# Patient Record
Sex: Male | Born: 1990 | Race: White | Hispanic: No | Marital: Single | Smoking: Former smoker
Health system: Southern US, Community
[De-identification: ages and names within clinical notes are randomized; demographics above are authoritative.]

---
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
## 2013-11-01 ENCOUNTER — Encounter (HOSPITAL_BASED_OUTPATIENT_CLINIC_OR_DEPARTMENT_OTHER): Payer: Self-pay | Admitting: Emergency Medicine

## 2013-11-01 ENCOUNTER — Emergency Department (HOSPITAL_BASED_OUTPATIENT_CLINIC_OR_DEPARTMENT_OTHER)
Admission: EM | Admit: 2013-11-01 | Discharge: 2013-11-01 | Disposition: A | Discharge status: Home / Self Care | Payer: Managed Care, Other (non HMO) | Attending: Emergency Medicine | Admitting: Emergency Medicine

## 2013-11-01 ENCOUNTER — Emergency Department (HOSPITAL_BASED_OUTPATIENT_CLINIC_OR_DEPARTMENT_OTHER): Payer: Managed Care, Other (non HMO)

## 2013-11-01 DIAGNOSIS — S60222A Contusion of left hand, initial encounter: Secondary | ICD-10-CM

## 2013-11-01 DIAGNOSIS — Y9389 Activity, other specified: Secondary | ICD-10-CM | POA: Insufficient documentation

## 2013-11-01 DIAGNOSIS — Z87891 Personal history of nicotine dependence: Secondary | ICD-10-CM | POA: Insufficient documentation

## 2013-11-01 DIAGNOSIS — S63509A Unspecified sprain of unspecified wrist, initial encounter: Secondary | ICD-10-CM | POA: Insufficient documentation

## 2013-11-01 DIAGNOSIS — S60229A Contusion of unspecified hand, initial encounter: Secondary | ICD-10-CM | POA: Insufficient documentation

## 2013-11-01 DIAGNOSIS — W2209XA Striking against other stationary object, initial encounter: Secondary | ICD-10-CM | POA: Insufficient documentation

## 2013-11-01 DIAGNOSIS — Y929 Unspecified place or not applicable: Secondary | ICD-10-CM | POA: Insufficient documentation

## 2013-11-01 DIAGNOSIS — S63502A Unspecified sprain of left wrist, initial encounter: Secondary | ICD-10-CM

## 2013-11-01 MED ORDER — IBUPROFEN 100 MG/5ML PO SUSP: 600.0000 mg | Freq: Once | ORAL | Status: AC

## 2013-11-01 MED ORDER — IBUPROFEN 200 MG PO TABS
ORAL_TABLET | ORAL | Status: AC
  Administered 2013-11-01: 200 mg
  Filled 2013-11-01: qty 1

## 2013-11-01 MED ORDER — IBUPROFEN 400 MG PO TABS
ORAL_TABLET | ORAL | Status: AC
  Administered 2013-11-01: 400 mg
  Filled 2013-11-01: qty 1

## 2013-11-01 MED ORDER — NAPROXEN 500 MG PO TABS: 500.0000 mg | ORAL_TABLET | Freq: Two times a day (BID) | ORAL | Status: AC
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
## 2013-11-01 NOTE — ED Provider Notes (Signed)
History/physical exam/procedure(s) were performed by non-physician practitioner and as supervising physician I was immediately available for consultation/collaboration. I have reviewed all notes and am in agreement with care and plan.   Phyllistine Domingos S Lonnetta Kniskern, MD 11/01/13 2333 

## 2015-10-24 IMAGING — CR DG HAND COMPLETE 3+V*L*
3 series · 3 of 3 positions shown · non-contrast
Comparison: None.

CLINICAL DATA: Left hand/wrist pain

EXAM:
LEFT HAND - COMPLETE 3+ VIEW

[x hand pa left]
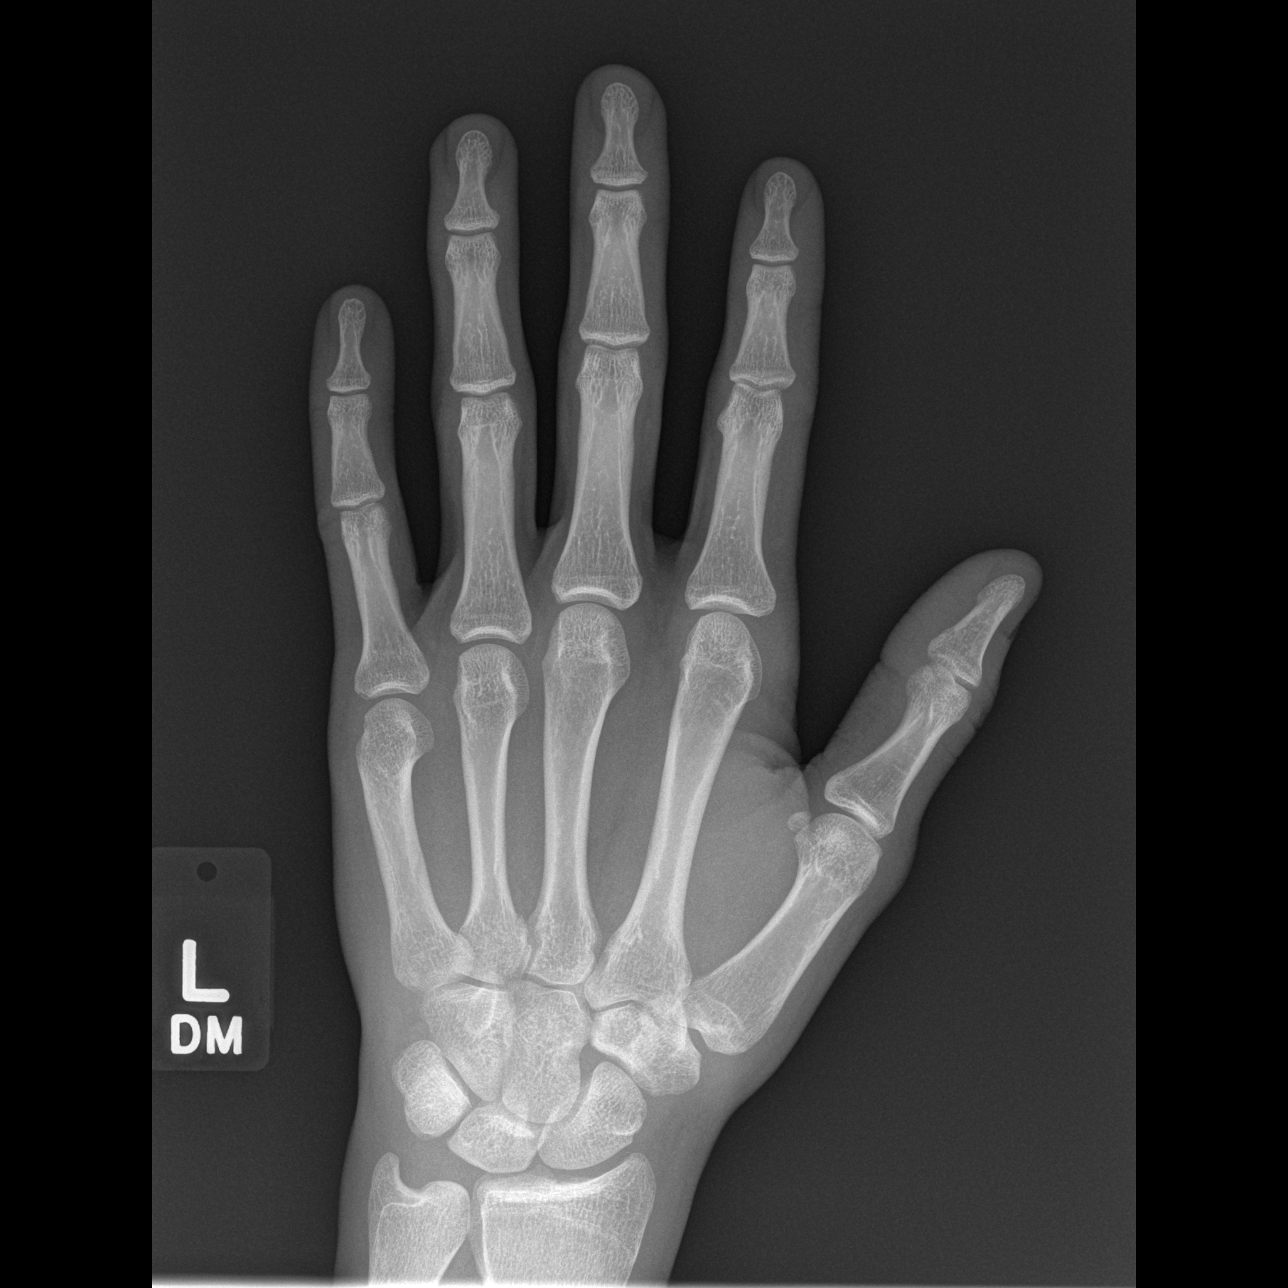

[x hand oblique left]
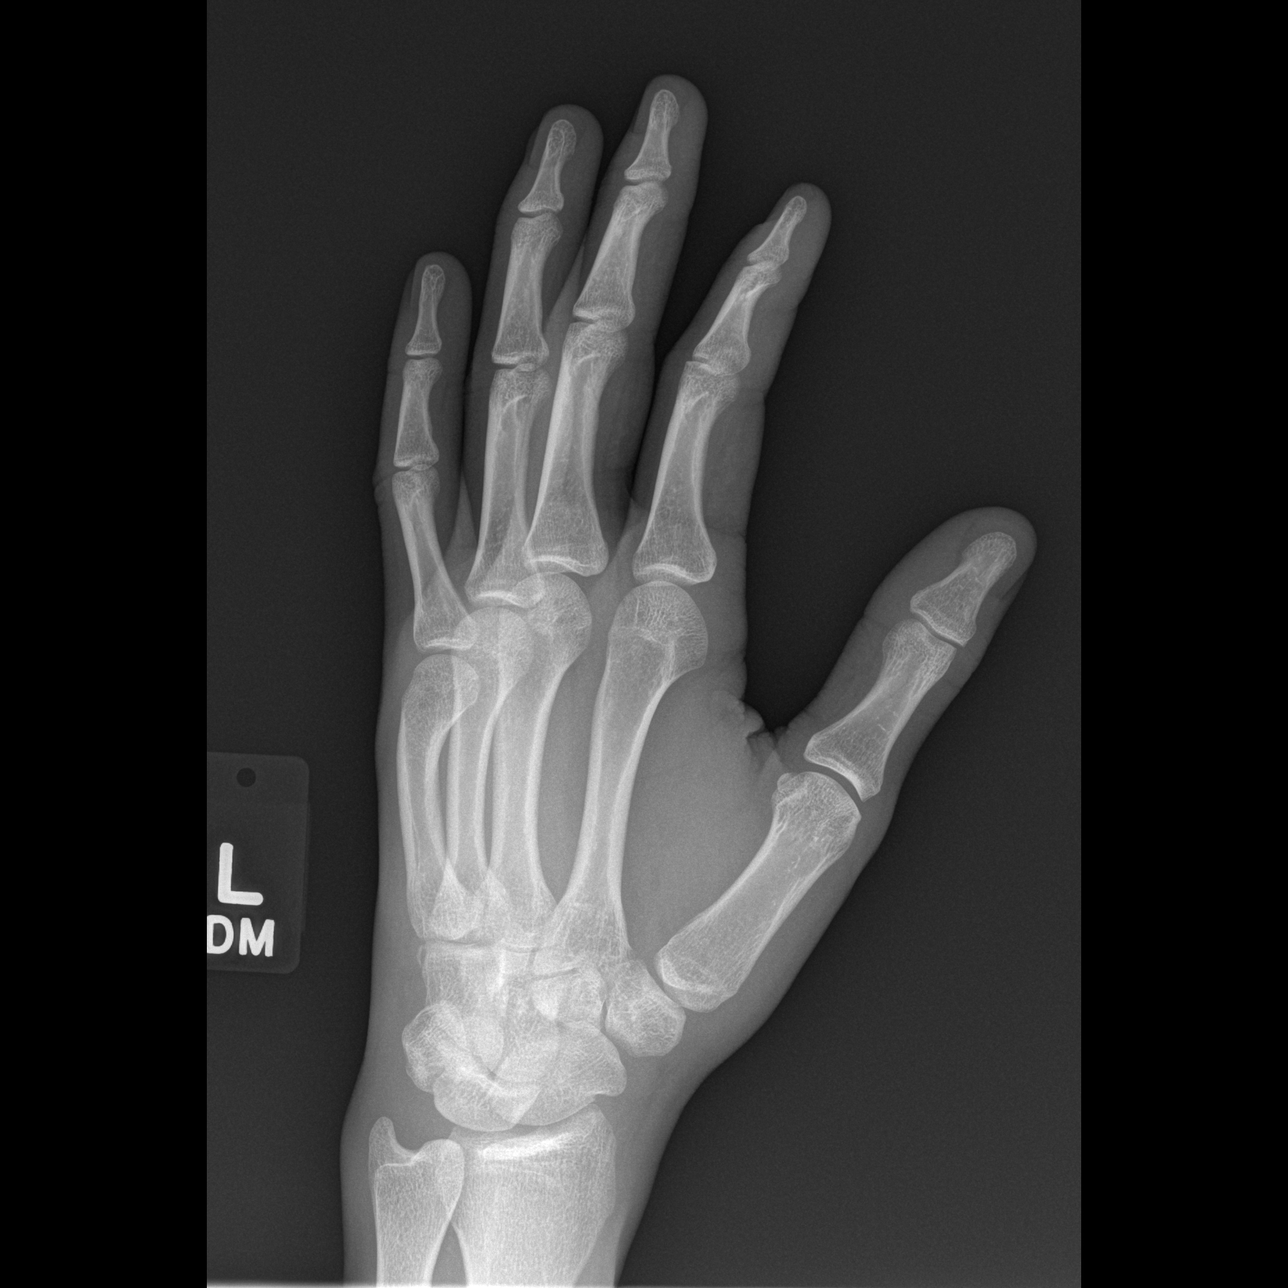

[x hand lat left]
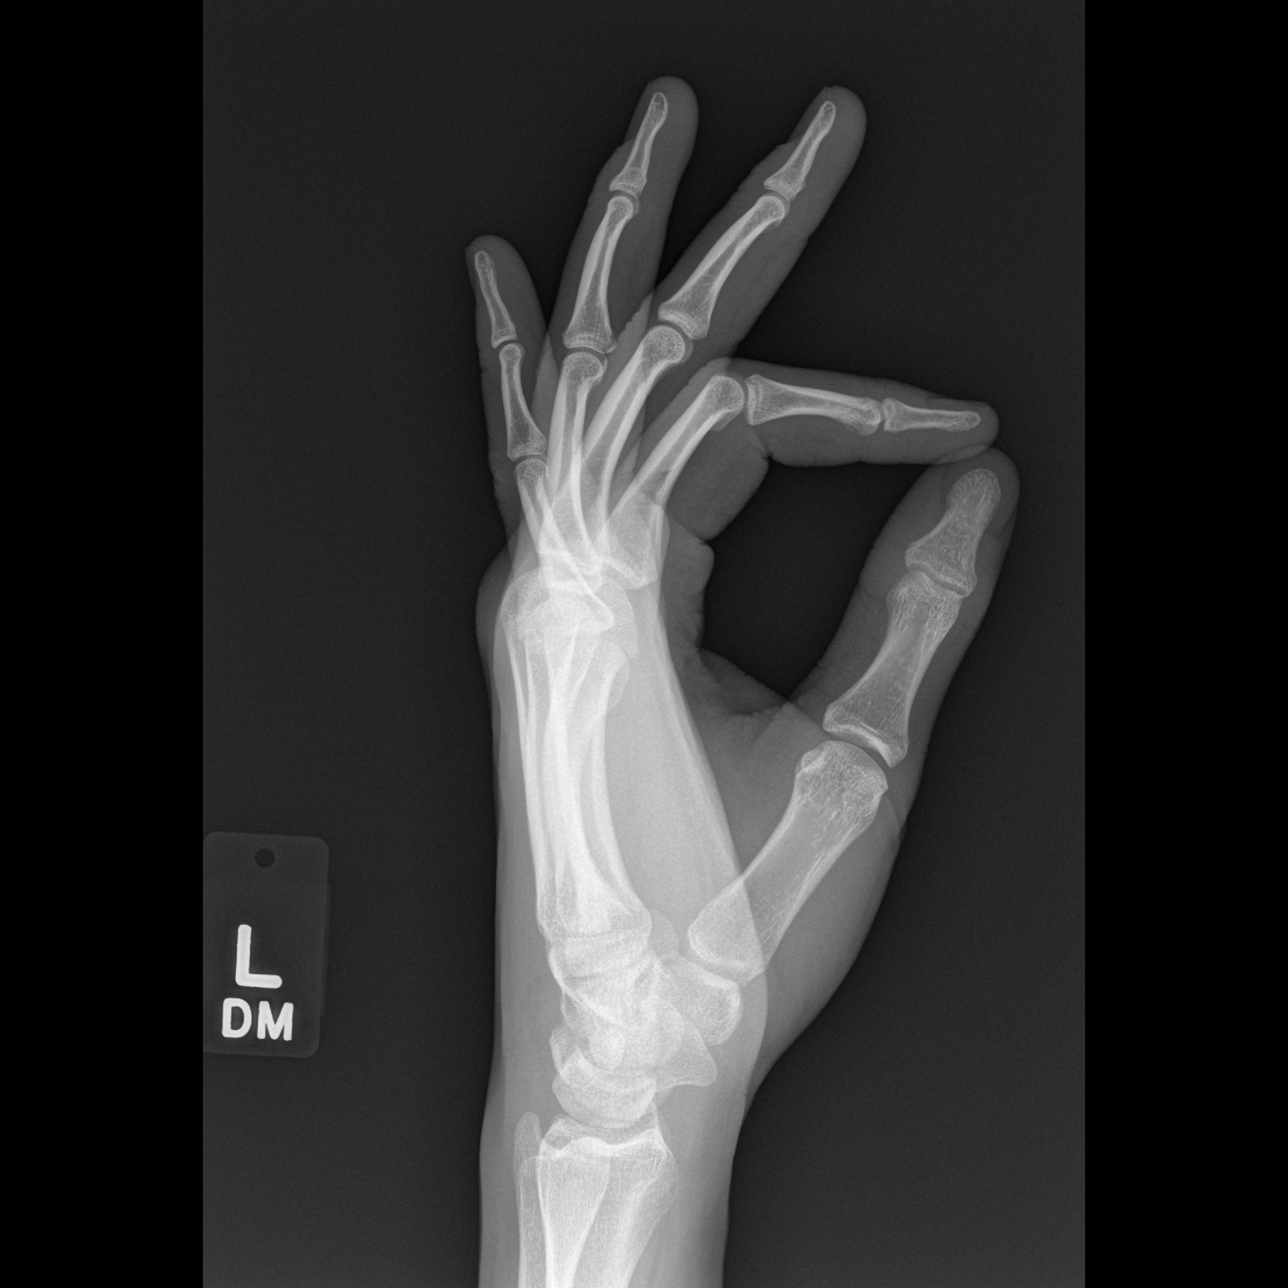

[3 of 3 positions shown; findings below may reference images not displayed]

FINDINGS: No fracture or dislocation is seen.

Mild bowing of the 5th metacarpal, possibly reflecting healed
deformity related to prior trauma.

The joint spaces are preserved.

The visualized soft tissues are unremarkable.
IMPRESSION: No fracture or dislocation is seen.

## 2016-04-01 ENCOUNTER — Emergency Department (HOSPITAL_COMMUNITY): Payer: Managed Care, Other (non HMO)

## 2016-04-01 ENCOUNTER — Encounter (HOSPITAL_COMMUNITY): Payer: Self-pay | Admitting: Emergency Medicine

## 2016-04-01 ENCOUNTER — Emergency Department (HOSPITAL_COMMUNITY)
Admission: EM | Admit: 2016-04-01 | Discharge: 2016-04-02 | Disposition: A | Discharge status: Home / Self Care | Payer: Managed Care, Other (non HMO) | Attending: Emergency Medicine | Admitting: Emergency Medicine

## 2016-04-01 DIAGNOSIS — R0789 Other chest pain: Secondary | ICD-10-CM

## 2016-04-01 DIAGNOSIS — Z87891 Personal history of nicotine dependence: Secondary | ICD-10-CM | POA: Insufficient documentation

## 2016-04-01 DIAGNOSIS — R002 Palpitations: Secondary | ICD-10-CM | POA: Insufficient documentation

## 2016-04-01 DIAGNOSIS — R079 Chest pain, unspecified: Secondary | ICD-10-CM | POA: Diagnosis present

## 2016-04-01 LAB — CBC
HCT: 47.2 % (ref 39.0–52.0)
HEMOGLOBIN: 15.9 g/dL (ref 13.0–17.0)
MCH: 30.1 pg (ref 26.0–34.0)
MCHC: 33.7 g/dL (ref 30.0–36.0)
MCV: 89.2 fL (ref 78.0–100.0)
PLATELETS: 238 10*3/uL (ref 150–400)
RBC: 5.29 MIL/uL (ref 4.22–5.81)
RDW: 12.7 % (ref 11.5–15.5)
WBC: 10 10*3/uL (ref 4.0–10.5)

## 2016-04-01 LAB — BASIC METABOLIC PANEL
ANION GAP: 11 (ref 5–15)
BUN: 10 mg/dL (ref 6–20)
CALCIUM: 9.8 mg/dL (ref 8.9–10.3)
CO2: 23 mmol/L (ref 22–32)
CREATININE: 0.92 mg/dL (ref 0.61–1.24)
Chloride: 99 mmol/L — ABNORMAL LOW (ref 101–111)
Glucose, Bld: 98 mg/dL (ref 65–99)
Potassium: 3.8 mmol/L (ref 3.5–5.1)
SODIUM: 133 mmol/L — AB (ref 135–145)

## 2016-04-01 LAB — I-STAT TROPONIN, ED: TROPONIN I, POC: 0 ng/mL (ref 0.00–0.08)

## 2016-04-01 MED ORDER — KETOROLAC TROMETHAMINE 10 MG PO TABS: 10.0000 mg | ORAL_TABLET | Freq: Four times a day (QID) | ORAL | 0 refills | Status: AC | PRN

## 2016-04-01 MED ORDER — KETOROLAC TROMETHAMINE 30 MG/ML IJ SOLN
30.0000 mg | Freq: Once | INTRAMUSCULAR | Status: AC
  Administered 2016-04-01: 30 mg via INTRAMUSCULAR
  Filled 2016-04-01: qty 1

## 2016-04-01 NOTE — ED Provider Notes (Signed)
[facility]-EMERGENCY DEPT Provider Note   CSN: [id number] Arrival date & time: 04/01/16  1716     History   Chief Complaint Chief Complaint  Patient presents with  . Chest Pain    HPI Danny Hicks is a 25 y.o. male.  HPI  Patient presents with ongoing chest pain. Patient is here with his wife assists with the history of present illness. Over the past few days the patient has had episodes of brief, moderate left-sided chest pain. However, today, for the entirety of the day, approximately 12 hours patient has had pain focally the left pectoralis region. Pain radiates posteriorly. There is no associated dyspnea. There is mild cough, slight temperature elevation, but no fever. Patient is generally well, denies medical problems, does not smoke. No recent travel, other health or diet changes. Patient has one family member recently diagnosed with infection.   History reviewed. No pertinent past medical history.  There are no active problems to display for this patient.   History reviewed. No pertinent surgical history.     Home Medications    Prior to Admission medications   Medication Sig Start Date End Date Taking? Authorizing Provider  naproxen (NAPROSYN) 500 MG tablet Take 1 tablet (500 mg total) by mouth 2 (two) times daily. 11/01/13   Hope Orlene OchM Neese, NP    Family History No family history on file.  Social History Social History  Substance Use Topics  . Smoking status: Former Games developermoker  . Smokeless tobacco: Never Used  . Alcohol use Yes     Allergies   Review of patient's allergies indicates no known allergies.   Review of Systems Review of Systems  Constitutional:       Per HPI, otherwise negative  HENT:       Per HPI, otherwise negative  Respiratory:       Per HPI, otherwise negative  Cardiovascular:       Per HPI, otherwise negative  Gastrointestinal: Negative for vomiting.  Endocrine:       Negative aside from HPI  Genitourinary:       Neg aside  from HPI   Musculoskeletal:       Per HPI, otherwise negative  Skin: Negative.   Neurological: Negative for syncope.     Physical Exam Updated Vital Signs BP 132/66 (BP Location: Right Arm)   Pulse 91   Temp 99.1 F (37.3 C) (Oral)   Resp 24   Ht 6\' 5"  (1.956 m)   Wt 168 lb (76.2 kg)   SpO2 100%   BMI 19.92 kg/m   Physical Exam  Constitutional: He is oriented to person, place, and time. He appears well-developed. No distress.  HENT:  Head: Normocephalic and atraumatic.  Eyes: Conjunctivae and EOM are normal.  Cardiovascular: Normal rate and regular rhythm.   Pulmonary/Chest: Effort normal. No stridor. No respiratory distress.  No appreciable tenderness to palpation  Abdominal: He exhibits no distension.  Musculoskeletal: He exhibits no edema.  Neurological: He is alert and oriented to person, place, and time.  Skin: Skin is warm and dry.  Psychiatric: He has a normal mood and affect.  Nursing note and vitals reviewed.    ED Treatments / Results  Labs (all labs ordered are listed, but only abnormal results are displayed) Labs Reviewed  BASIC METABOLIC PANEL - Abnormal; Notable for the following:       Result Value   Sodium 133 (*)    Chloride 99 (*)    All other components within normal limits  CBC  I-STAT TROPOININ, ED     EKG from urgent care has rate 75 RSR prime in V1, otherwise unremarkable EKG  Radiology Dg Chest 2 View  Result Date: 04/01/2016 CLINICAL DATA:  Left sided CP began earlier today, radiating into the left back; Pain is worse upon deep inspiration; No known heart or respiratory problems; Some SOB EXAM: CHEST  2 VIEW COMPARISON:  None. FINDINGS: Midline trachea.  Normal heart size and mediastinal contours. Sharp costophrenic angles.  No pneumothorax.  Clear lungs. IMPRESSION: No active cardiopulmonary disease. Electronically Signed   By: Jeronimo GreavesKyle  Talbot M.D.   On: 04/01/2016 18:10    Procedures Procedures (including critical care time)  On  repeat exam the patient went in similar condition. Patient now clarifies that he has had episodes of chest pain for years, also notes that he has had episodes of palpitations, primarily at night, though these do not occur simultaneously.   Medications Ordered in ED Medications  ketorolac (TORADOL) 30 MG/ML injection 30 mg (30 mg Intramuscular Given 04/01/16 2204)   Well-appearing young male presents with episodic chest pain, now present for part of my 12 hours consistently. Here he is awake, alert, hemodynamically stable, in no distress. X-rays unremarkable EKG, troponin not suggestive of ongoing ischemia, patient has no risk factors for pulmonary embolism. Patient does have trace fever, but no cough, no evidence for pneumonia. We discussed possibility of occult infection, importance of following up with primary care. Patient also will follow-up with cardiology.    Gerhard Munchobert Sona Nations, MD 04/01/16 [phone]

## 2016-04-01 NOTE — ED Triage Notes (Signed)
Pt c/o left sided chest pain and back pain onset today.

## 2016-04-01 NOTE — Discharge Instructions (Signed)
As discussed, your evaluation today has been largely reassuring.  But, it is important that you monitor your condition carefully, and do not hesitate to return to the ED if you develop new, or concerning changes in your condition. ? ?Otherwise, please follow-up with your physician for appropriate ongoing care. ? ?

## 2016-04-02 NOTE — ED Notes (Signed)
Pt provided with d/c instructions at this time. Pt verbalizes understanding of d/c instructions as well as follow up procedure after d/c. Pt provided with RX for toradol.  Pt verbalizes understanding of RX directions. Pt in no apparent distress at this time. Pt ambulatory at time of d/c.

## 2018-03-24 IMAGING — CR DG CHEST 2V
2 series · 2 of 2 positions shown · non-contrast
Comparison: None.

CLINICAL DATA: Left sided CP began earlier today, radiating into
the left back; Pain is worse upon deep inspiration; No known heart
or respiratory problems; Some SOB

EXAM:
CHEST  2 VIEW

[chest pa]
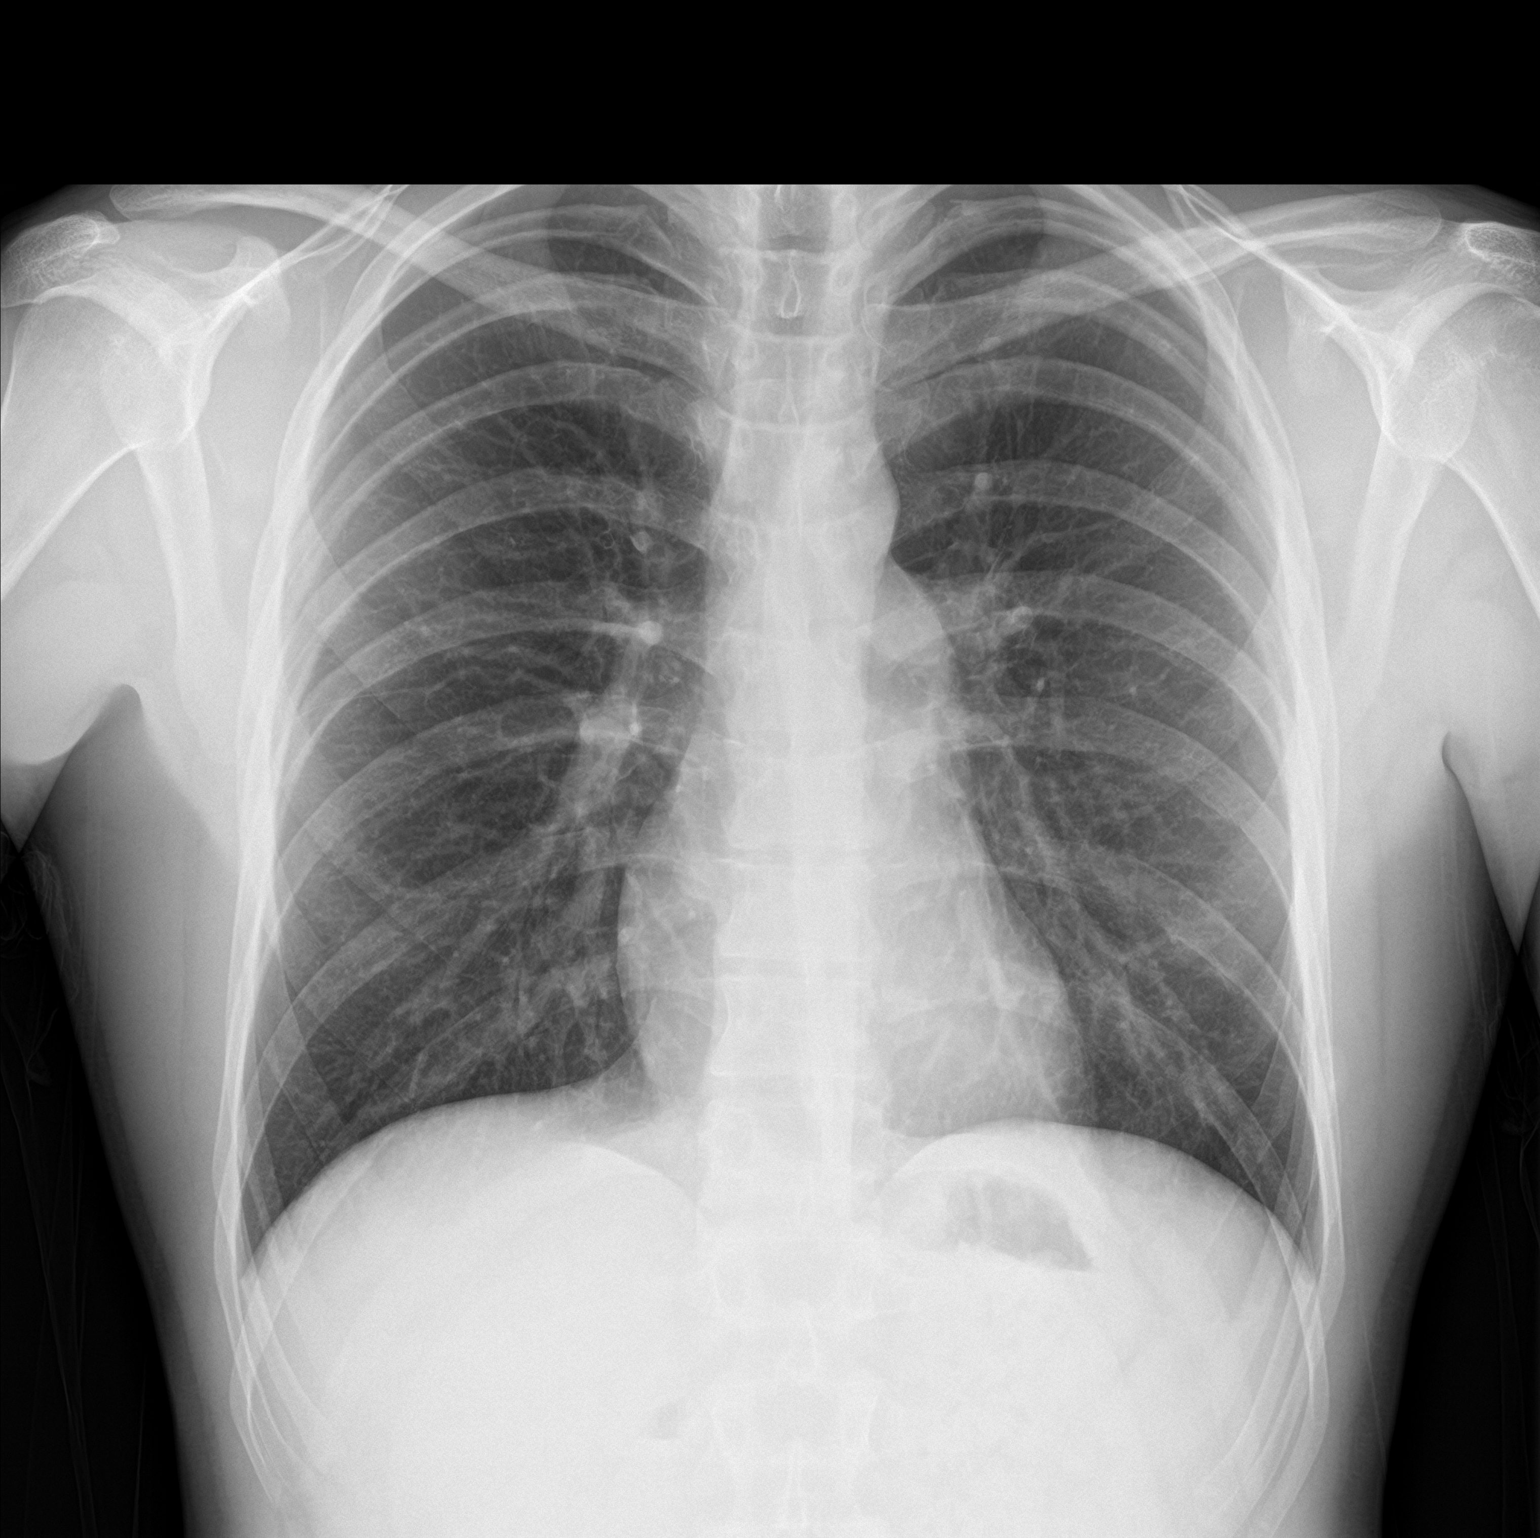

[chest lat]
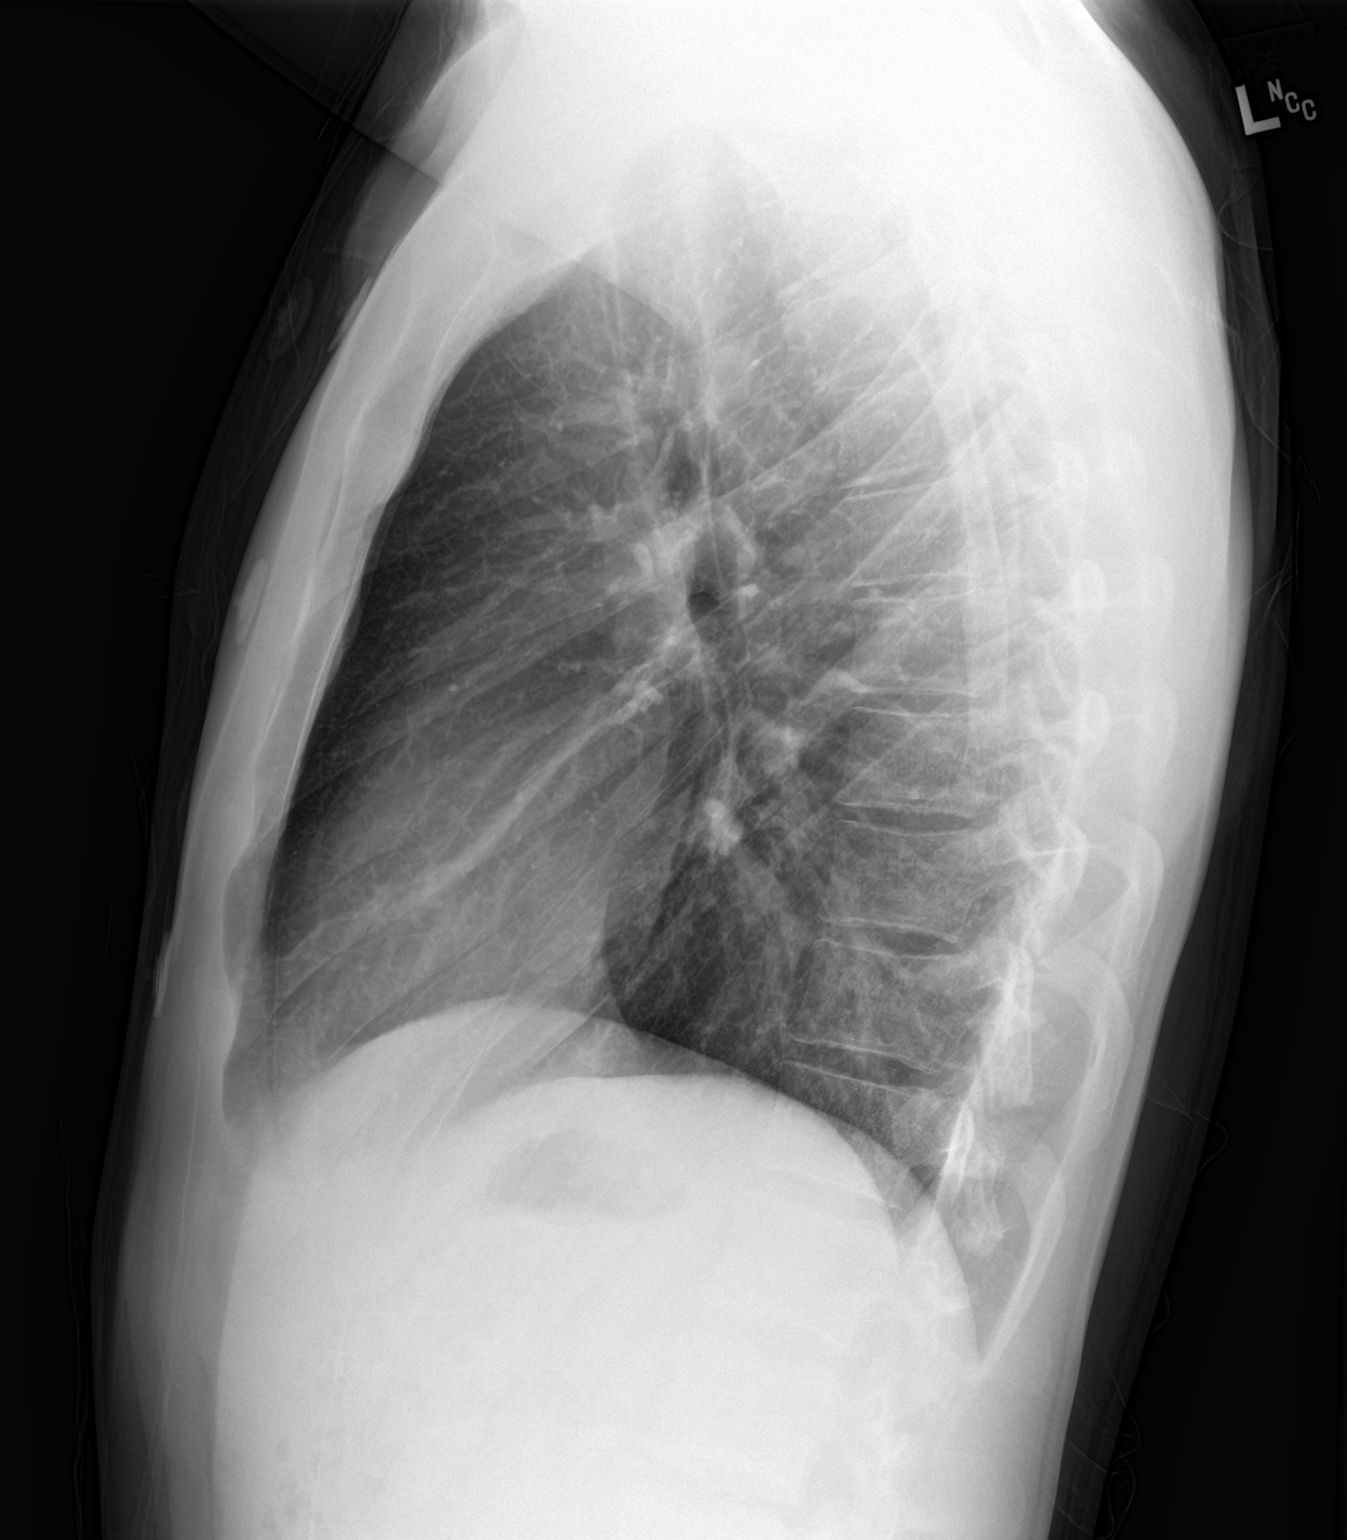

[2 of 2 positions shown; findings below may reference images not displayed]

FINDINGS: Midline trachea.  Normal heart size and mediastinal contours.

Sharp costophrenic angles.  No pneumothorax.  Clear lungs.
IMPRESSION: No active cardiopulmonary disease.
